# Patient Record
Sex: Male | Born: 2005 | Race: White | Hispanic: No | Marital: Single | Smoking: Never smoker
Health system: Southern US, Community
[De-identification: ages and names within clinical notes are randomized; demographics above are authoritative.]

## PROBLEM LIST (undated history)

## (undated) DIAGNOSIS — J45909 Unspecified asthma, uncomplicated: Secondary | ICD-10-CM

## (undated) HISTORY — DX: Unspecified asthma, uncomplicated: J45.909

---
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
## 2023-01-22 ENCOUNTER — Ambulatory Visit: Payer: Self-pay | Admitting: Dermatology

## 2023-12-10 ENCOUNTER — Encounter: Payer: Self-pay | Admitting: Dermatology

## 2023-12-10 ENCOUNTER — Ambulatory Visit: Admitting: Dermatology

## 2023-12-10 ENCOUNTER — Other Ambulatory Visit: Payer: Self-pay | Admitting: Dermatology

## 2023-12-10 DIAGNOSIS — L700 Acne vulgaris: Secondary | ICD-10-CM

## 2023-12-10 MED ORDER — ARAZLO 0.045 % EX LOTN: 1.0000 | TOPICAL_LOTION | Freq: Every evening | CUTANEOUS | 2 refills | Status: DC

## 2023-12-10 MED ORDER — DOXYCYCLINE HYCLATE 100 MG PO TABS: 100.0000 mg | ORAL_TABLET | Freq: Every day | ORAL | 1 refills | Status: DC

## 2023-12-10 MED ORDER — CLINDAMYCIN PHOSPHATE 1 % EX SWAB: 1.0000 | Freq: Two times a day (BID) | CUTANEOUS | 4 refills | Status: AC

## 2023-12-10 NOTE — Telephone Encounter (Signed)
 This rx is brand name and must go to [facility]. Please resend.  Thanks

## 2023-12-10 NOTE — Progress Notes (Signed)
   Follow-Up Visit   Subjective  Vincent Page is a 18 y.o. male established patient who presents for FOLLOW UP on the diagnoses listed below:  Patient was last evaluated on 08/24/22.   Acne: Prescribed Doxycyline 100 to take daily, clindamycin  swabs on affected areas daily, Arazlo  to apply MWF nights. Recommended washing areas with BPO ws daily. Patient reports sxs has improved. He D/C clindamycin  swabs & Arazlo . He has been using Panoxyl since the beginning of the year which was working well but has started flaring bad over the last month.     The following portions of the chart were reviewed this encounter and updated as appropriate: medications, allergies, medical history  Review of Systems:  No other skin or systemic complaints except as noted in HPI or Assessment and Plan.  Objective  Well appearing patient in no apparent distress; mood and affect are within normal limits.   A focused examination was performed of the following areas: face, chest & back   Relevant exam findings are noted in the Assessment and Plan.               Assessment & Plan   1. Acne Vulgaris - Assessment: Patient presents with acne flare affecting face, chest, and back approximately one month after discontinuing topical and oral treatments. Previously had good control with benzoyl peroxide wash alone following successful treatment with oral doxycycline , topical Arazlo , and clindamycin . Patient has residual scarring from previous acne lesions. Last doxycycline  use was approximately one year ago.  - Plan:    Resume systemic therapy:     - Doxycycline  100 mg nightly with dinner for 2 months    Topical regimen:     - Start clindamycin  swabs every morning to face, chest, and back after showering     - Continue Arazlo  3 nights per week (reduce to 2 nights if skin irritation occurs)     - Face application: pea-sized amount applied by dabbing then rubbing in     - Body application: mix pea-sized Arazlo   with quarter-sized amount of CeraVe or Cetaphil moisturizer     - Continue Panoxyl wash    Moisturizer regimen:     - Start Cyclofate moisturizer for nighttime use on face, apply over Arazlo  and on non-Arazlo  nights     - Continue current CeraVe moisturizer for body    Patient education:     - Always continue Arazlo  even when clear to prevent new acne and improve scarring over time through collagen stimulation    Consider stronger treatment or Accutane if not clear by spring follow-up visit  Follow-up scheduled for end of February.    No follow-ups on file.   Documentation: I have reviewed the above documentation for accuracy and completeness, and I agree with the above.  I, Shirron Maranda, CMA, am acting as scribe for [employer], DO.   Delon Lenis, DO

## 2023-12-12 ENCOUNTER — Other Ambulatory Visit: Payer: Self-pay

## 2023-12-12 DIAGNOSIS — L700 Acne vulgaris: Secondary | ICD-10-CM

## 2023-12-12 MED ORDER — ARAZLO 0.045 % EX LOTN: 1.0000 | TOPICAL_LOTION | Freq: Every evening | CUTANEOUS | 2 refills | Status: AC

## 2023-12-12 NOTE — Telephone Encounter (Signed)
 Rx has been sent to [facility].

## 2024-02-06 ENCOUNTER — Other Ambulatory Visit: Payer: Self-pay | Admitting: Dermatology

## 2024-02-06 DIAGNOSIS — L700 Acne vulgaris: Secondary | ICD-10-CM

## 2024-05-08 ENCOUNTER — Ambulatory Visit: Admitting: Dermatology
